# Patient Record
Sex: Female | Born: 1945 | Race: White | Hispanic: No
Health system: Southern US, Community
[De-identification: ages and names within clinical notes are randomized; demographics above are authoritative.]

---

## 2016-08-12 ENCOUNTER — Encounter (HOSPITAL_COMMUNITY): Payer: Self-pay | Admitting: Emergency Medicine

## 2016-08-12 ENCOUNTER — Emergency Department (HOSPITAL_COMMUNITY)
Admission: EM | Admit: 2016-08-12 | Discharge: 2016-08-12 | Disposition: A | Discharge status: Home / Self Care | Payer: Medicare HMO | Attending: Emergency Medicine | Admitting: Emergency Medicine

## 2016-08-12 ENCOUNTER — Emergency Department (HOSPITAL_COMMUNITY): Payer: Medicare HMO

## 2016-08-12 DIAGNOSIS — R0602 Shortness of breath: Secondary | ICD-10-CM

## 2016-08-12 DIAGNOSIS — I2510 Atherosclerotic heart disease of native coronary artery without angina pectoris: Secondary | ICD-10-CM | POA: Insufficient documentation

## 2016-08-12 DIAGNOSIS — Z853 Personal history of malignant neoplasm of breast: Secondary | ICD-10-CM | POA: Insufficient documentation

## 2016-08-12 DIAGNOSIS — R911 Solitary pulmonary nodule: Secondary | ICD-10-CM | POA: Diagnosis not present

## 2016-08-12 DIAGNOSIS — I252 Old myocardial infarction: Secondary | ICD-10-CM | POA: Diagnosis not present

## 2016-08-12 DIAGNOSIS — J209 Acute bronchitis, unspecified: Secondary | ICD-10-CM | POA: Insufficient documentation

## 2016-08-12 DIAGNOSIS — I509 Heart failure, unspecified: Secondary | ICD-10-CM | POA: Diagnosis not present

## 2016-08-12 LAB — COMPREHENSIVE METABOLIC PANEL
ALBUMIN: 3.4 g/dL — AB (ref 3.5–5.0)
ALK PHOS: 132 U/L — AB (ref 38–126)
ALT: 13 U/L — AB (ref 14–54)
ANION GAP: 11 (ref 5–15)
AST: 16 U/L (ref 15–41)
BILIRUBIN TOTAL: 0.5 mg/dL (ref 0.3–1.2)
BUN: 11 mg/dL (ref 6–20)
CALCIUM: 9.5 mg/dL (ref 8.9–10.3)
CO2: 27 mmol/L (ref 22–32)
CREATININE: 1.06 mg/dL — AB (ref 0.44–1.00)
Chloride: 97 mmol/L — ABNORMAL LOW (ref 101–111)
GFR calc Af Amer: 59 mL/min — ABNORMAL LOW (ref >60)
GFR calc non Af Amer: 51 mL/min — ABNORMAL LOW (ref >60)
GLUCOSE: 148 mg/dL — AB (ref 65–99)
Potassium: 3.5 mmol/L (ref 3.5–5.1)
SODIUM: 135 mmol/L (ref 135–145)
TOTAL PROTEIN: 6.8 g/dL (ref 6.5–8.1)

## 2016-08-12 LAB — CBC WITH DIFFERENTIAL/PLATELET
BASOS PCT: 1 %
Basophils Absolute: 0.1 10*3/uL (ref 0.0–0.1)
EOS PCT: 2 %
Eosinophils Absolute: 0.2 10*3/uL (ref 0.0–0.7)
HEMATOCRIT: 41.4 % (ref 36.0–46.0)
Hemoglobin: 13.7 g/dL (ref 12.0–15.0)
Lymphocytes Relative: 24 %
Lymphs Abs: 1.7 10*3/uL (ref 0.7–4.0)
MCH: 28.3 pg (ref 26.0–34.0)
MCHC: 33.1 g/dL (ref 30.0–36.0)
MCV: 85.5 fL (ref 78.0–100.0)
MONO ABS: 0.4 10*3/uL (ref 0.1–1.0)
MONOS PCT: 6 %
Neutro Abs: 5 10*3/uL (ref 1.7–7.7)
Neutrophils Relative %: 67 %
PLATELETS: 430 10*3/uL — AB (ref 150–400)
RBC: 4.84 MIL/uL (ref 3.87–5.11)
RDW: 12.7 % (ref 11.5–15.5)
WBC: 7.4 10*3/uL (ref 4.0–10.5)

## 2016-08-12 LAB — BRAIN NATRIURETIC PEPTIDE: B Natriuretic Peptide: 30.1 pg/mL (ref 0.0–100.0)

## 2016-08-12 LAB — I-STAT TROPONIN, ED: Troponin i, poc: 0 ng/mL (ref 0.00–0.08)

## 2016-08-12 LAB — I-STAT CG4 LACTIC ACID, ED: Lactic Acid, Venous: 1.64 mmol/L (ref 0.5–1.9)

## 2016-08-12 MED ORDER — HYDROCODONE-ACETAMINOPHEN 5-325 MG PO TABS
1.0000 | ORAL_TABLET | Freq: Once | ORAL | Status: AC
  Administered 2016-08-12: 1 via ORAL
  Filled 2016-08-12: qty 1

## 2016-08-12 MED ORDER — AZITHROMYCIN 250 MG PO TABS
500.0000 mg | ORAL_TABLET | Freq: Once | ORAL | Status: AC
  Administered 2016-08-12: 500 mg via ORAL
  Filled 2016-08-12: qty 2

## 2016-08-12 MED ORDER — HYDROCODONE-ACETAMINOPHEN 5-325 MG PO TABS: 2.0000 | ORAL_TABLET | ORAL | 0 refills | Status: AC | PRN

## 2016-08-12 MED ORDER — AZITHROMYCIN 250 MG PO TABS: 250.0000 mg | ORAL_TABLET | Freq: Every day | ORAL | 0 refills | Status: AC

## 2016-08-12 MED ORDER — IOPAMIDOL (ISOVUE-370) INJECTION 76%
100.0000 mL | Freq: Once | INTRAVENOUS | Status: AC | PRN
  Administered 2016-08-12: 100 mL via INTRAVENOUS

## 2016-08-12 MED ORDER — ALBUTEROL SULFATE HFA 108 (90 BASE) MCG/ACT IN AERS
2.0000 | INHALATION_SPRAY | Freq: Once | RESPIRATORY_TRACT | Status: AC
  Administered 2016-08-12: 2 via RESPIRATORY_TRACT
  Filled 2016-08-12: qty 6.7

## 2016-08-12 NOTE — ED Provider Notes (Signed)
Pt seen and evaluated. Discussed with resident. Pt with distand h/o Breast Ca s/p Partial resection, Ctx, and Rtx. Pt sob. Afebrile, Not hypoxemic. CXR with nonspecific density right hemithorax. CT with ?infiltrative carcinomatosis, ?scarring vs ?active disease. NO PE, no Pneumonia.  Agree with DC. Agree with antibiotic and albuterol. Discussed with patient that she will need oncology f/u and likely additional testing to determine active dz vs. Scarring/effects of treatment.   Rolland PorterJames, Shaniquia Brafford, MD 08/12/16 2006

## 2016-08-12 NOTE — ED Notes (Signed)
The pt has a little pain with irrigation bu the site lookd ok  No swelling  It irrigates well

## 2016-08-12 NOTE — ED Triage Notes (Signed)
Pt to ER for nonproductive cough x3-4 days with shortness of breath and pain in mid back. A/o x4. Sats 100% on RA.

## 2016-08-12 NOTE — ED Notes (Signed)
Doctor at the bedside 

## 2016-08-12 NOTE — ED Provider Notes (Signed)
[facility]-EMERGENCY DEPT Provider Note   CSN: [id number] Arrival date & time: 08/12/16  1152     History   Chief Complaint Chief Complaint  Patient presents with  . Shortness of Breath    HPI Tracy Beck is a 71 y.o. female.  The history is provided by the patient.  Shortness of Breath  This is a new problem. The average episode lasts 4 days. The problem occurs frequently.The current episode started more than 2 days ago. The problem has been gradually worsening. Associated symptoms include cough. Pertinent negatives include no fever, no headaches, no coryza, no rhinorrhea, no sore throat, no swollen glands, no ear pain, no neck pain, no sputum production, no hemoptysis, no wheezing, no chest pain, no vomiting, no abdominal pain, no rash, no leg pain, no leg swelling and no claudication. It is unknown what precipitated the problem. She has tried nothing for the symptoms. The treatment provided no relief. Associated medical issues include CAD, heart failure and past MI. Associated medical issues do not include PE, DVT or recent surgery.    History reviewed. No pertinent past medical history.  There are no active problems to display for this patient.   History reviewed. No pertinent surgical history.  OB History    No data available       Home Medications    Prior to Admission medications   Medication Sig Start Date End Date Taking? Authorizing Provider  azithromycin (ZITHROMAX) 250 MG tablet Take 1 tablet (250 mg total) by mouth daily. Take first 2 tablets together, then 1 every day until finished. 08/12/16 08/16/16  Dawnya Grams, DO  HYDROcodone-acetaminophen (NORCO/VICODIN) 5-325 MG tablet Take 2 tablets by mouth every 4 (four) hours as needed. 08/12/16   Virgina Norfolk, DO    Family History No family history on file.  Social History Social History  Substance Use Topics  . Smoking status: Unknown If Ever Smoked  . Smokeless tobacco: Never Used  . Alcohol use Not on file       Allergies   Patient has no allergy information on record.   Review of Systems Review of Systems  Constitutional: Negative for chills and fever.  HENT: Negative for ear pain, rhinorrhea and sore throat.   Eyes: Negative for pain and visual disturbance.  Respiratory: Positive for cough and shortness of breath. Negative for hemoptysis, sputum production and wheezing.   Cardiovascular: Negative for chest pain, palpitations, claudication and leg swelling.  Gastrointestinal: Negative for abdominal pain and vomiting.  Genitourinary: Negative for dysuria and hematuria.  Musculoskeletal: Negative for arthralgias, back pain and neck pain.  Skin: Negative for color change and rash.  Neurological: Negative for seizures, syncope and headaches.  All other systems reviewed and are negative.    Physical Exam Updated Vital Signs  ED Triage Vitals [08/12/16 1204]  Enc Vitals Group     BP 108/60     Pulse Rate 82     Resp (!) 24     Temp 97.5 F (36.4 C)     Temp Source Oral     SpO2 100 %     Weight      Height      Head Circumference      Peak Flow      Pain Score 8     Pain Loc      Pain Edu?      Excl. in GC?     Physical Exam  Constitutional: She is oriented to person, place, and time. She appears  well-developed and well-nourished. No distress.  HENT:  Head: Normocephalic and atraumatic.  Eyes: Conjunctivae are normal. Pupils are equal, round, and reactive to light.  Neck: Normal range of motion. Neck supple.  Cardiovascular: Normal rate, regular rhythm, normal heart sounds and intact distal pulses.   Pulmonary/Chest: Effort normal. No respiratory distress. She has no wheezes. She has rales.  Abdominal: Soft. There is no tenderness.  Musculoskeletal: Normal range of motion. She exhibits no edema.  Neurological: She is alert and oriented to person, place, and time.  Skin: Skin is warm and dry. Capillary refill takes less than 2 seconds.  Psychiatric: She has a normal  mood and affect.  Nursing note and vitals reviewed.    ED Treatments / Results  Labs (all labs ordered are listed, but only abnormal results are displayed) Labs Reviewed  COMPREHENSIVE METABOLIC PANEL - Abnormal; Notable for the following:       Result Value   Chloride 97 (*)    Glucose, Bld 148 (*)    Creatinine, Ser 1.06 (*)    Albumin 3.4 (*)    ALT 13 (*)    Alkaline Phosphatase 132 (*)    GFR calc non Af Amer 51 (*)    GFR calc Af Amer 59 (*)    All other components within normal limits  CBC WITH DIFFERENTIAL/PLATELET - Abnormal; Notable for the following:    Platelets 430 (*)    All other components within normal limits  BRAIN NATRIURETIC PEPTIDE  I-STAT CG4 LACTIC ACID, ED  I-STAT CG4 LACTIC ACID, ED  I-STAT TROPOININ, ED    EKG  EKG Interpretation None       Radiology Dg Chest 2 View  Result Date: 08/12/2016 CLINICAL DATA:  Sensation of a mass in the right back with shortness of breath for the past few days. Dry cough for the past week. EXAM: CHEST  2 VIEW COMPARISON:  None. FINDINGS: Normal sized heart. Post CABG changes. Coronary artery stent. Prominent interstitial markings, greater on the right with mild asymmetrical prominence of the pulmonary vasculature in the right lower lung zone. No pleural fluid. Unremarkable bones. Approximately 25% L1 superior endplate compression deformity with mild bony retropulsion and no visible acute fracture lines. IMPRESSION: 1. Mild asymmetrical interstitial pulmonary edema or pneumonitis, greater on the right. 2. Mild asymmetrical pulmonary vascular congestion in the right lower lung zone. Electronically Signed   By: Beckie SaltsSteven  Reid M.D.   On: 08/12/2016 13:26   Ct Angio Chest Pe W Or Wo Contrast  Result Date: 08/12/2016 CLINICAL DATA:  71 y/o  F; shortness of breath and mid back pain. EXAM: CT ANGIOGRAPHY CHEST WITH CONTRAST TECHNIQUE: Multidetector CT imaging of the chest was performed using the standard protocol during bolus  administration of intravenous contrast. Multiplanar CT image reconstructions and MIPs were obtained to evaluate the vascular anatomy. CONTRAST:  100 cc Isovue 370 COMPARISON:  None. FINDINGS: Cardiovascular: Satisfactory opacification of the pulmonary arteries to the segmental level. No evidence of pulmonary embolism. Normal caliber thoracic aorta with mild calcific atherosclerosis. Normal heart size. Severe coronary artery calcifications status post CABG. Mediastinum/Nodes: 8 mm nodule in the left lobe of the thyroid. Prominent mediastinal lymph nodes the largest in the prevascular space measuring 8 x 13 mm (series 5, image 38). Left axillary adenopathy measuring 13 x 13 mm (series 5, image 24). Postsurgical changes related to right axial lymph node dissection. Ill-defined stellate focus of soft tissue in the right lateral breast at the site of multiple surgical staples  spanning 21 mm (series 5, image 57). Lungs/Pleura: Numerous pulmonary nodules scattered throughout the lungs measuring up to 10 mm in the right lung apex (series 5, image 21). Septal thickening greatest within the right middle and lower lobes. Diffuse peribronchial thickening. Upper Abdomen: 14 mm left adrenal nodule measuring -13 HU compatible with adenoma. Soft tissue attenuating lesions within the liver in segment 7 measuring 11 mm and segment 8 measuring 29 mm (series 5, image 113). Musculoskeletal: 14 mm lucent lesion within the T2 vertebral body (series 9, image 75). Healed median sternotomy. Review of the MIP images confirms the above findings. IMPRESSION: 1. Numerous pulmonary nodules, T2 vertebral body lucent lesion, and lesions within segment 7 and segment 8 of the liver. Findings are most consistent with metastatic disease. 2. Ill-defined stellate soft tissue in the right lateral breast adjacent to multiple surgical staples may represent scarring. Neoplasm not excluded. Correlate for history of breast cancer. 3. No pulmonary embolus  identified. 4. Severe coronary artery calcifications post CABG. 5. Left adrenal adenoma. 6. Diffuse peribronchial thickening may represent acute bronchitis. 7. Septal thickening greatest in the right middle and lower lobes may represent interstitial edema or lymphangitic carcinomatosis. Electronically Signed   By: Mitzi Hansen M.D.   On: 08/12/2016 19:36    Procedures Procedures (including critical care time)  Medications Ordered in ED Medications  azithromycin (ZITHROMAX) tablet 500 mg (not administered)  albuterol (PROVENTIL HFA;VENTOLIN HFA) 108 (90 Base) MCG/ACT inhaler 2 puff (not administered)  HYDROcodone-acetaminophen (NORCO/VICODIN) 5-325 MG per tablet 1 tablet (not administered)  iopamidol (ISOVUE-370) 76 % injection 100 mL (100 mLs Intravenous Contrast Given 08/12/16 1857)     Initial Impression / Assessment and Plan / ED Course  I have reviewed the triage vital signs and the nursing notes.  Pertinent labs & imaging results that were available during my care of the patient were reviewed by me and considered in my medical decision making (see chart for details).     Tracy Beck is a 71 year old female with history of breast cancer, CAD status post CABG, high blood pressure who presents to the ED with shortness of breath. Patient's vitals at time of arrival to the ED are unremarkable and patient is without fever. Patient states that she has had shortness of breath over the last several days with cough but no sputum production. Patient denies fever, chills. Patient states that she was a former smoker and is not on any inhalers and not aware of diagnosis of COPD. Patient with no chest pain, no abdominal pain, no leg swelling. On exam, patient has crackles throughout but no peripheral edema and no JVD. Exam is overall unremarkable otherwise. To evaluate will get CBC, CMP, lactic acid, BNP, troponin, EKG, chest x-ray.  Patient with EKG that shows normal sinus rhythm with no  signs of ischemia. There is no prior EKG to compare to. Troponin within normal limits. Patient with no chest pain and doubt ACS. Patient with BNP within normal limits and chest x-ray that shows some asymmetric edema versus pneumonitis. However, patient with no signs of volume overload on exam otherwise. Possible patient has PE versus pneumonia versus mass. Patient states that she was told that she had possible pulmonary nodule several months ago and has not had a chest CT. Patient otherwise with no significant anemia or electrolyte abnormalities. Patient with normal lactic acid and doubt infectious process.  CT PE study shows numerous pulmonary nodules with possible metastatic disease to the spine. Possible recurrent breast cancer. Patient with no  PE. Possible acute bronchitis. Patient made aware of findings and recommend follow-up with oncology. Patient states that she really has follow-up with pulmonology. Patient understands that she must follow-up with oncology, primary care, pulmonology. Patient likely will need PET scan. Will treat for acute bronchitis with a Zithromax, albuterol. Patient given Norco for pain and given prescription for Norco. Patient was looked up in the [place] controlled substance database and has no active prescriptions. Patient discharged from the ED in good condition. Patient with normal vital signs at time of discharge.  Final Clinical Impressions(s) / ED Diagnoses   Final diagnoses:  SOB (shortness of breath)  Acute bronchitis, unspecified organism  Pulmonary nodule    New Prescriptions New Prescriptions   AZITHROMYCIN (ZITHROMAX) 250 MG TABLET    Take 1 tablet (250 mg total) by mouth daily. Take first 2 tablets together, then 1 every day until finished.   HYDROCODONE-ACETAMINOPHEN (NORCO/VICODIN) 5-325 MG TABLET    Take 2 tablets by mouth every 4 (four) hours as needed.     Virgina Norfolk, DO 08/12/16 2015    Rolland Porter, MD 08/13/16 1504    Rolland Porter, MD 08/13/16 [phone]

## 2016-08-12 NOTE — ED Notes (Signed)
Ambulated pt in hallway while monitoring O2. Pt started at 98% while sitting. While ambulating, pt's O2 dropped to 94%. Pt's O2 read between 97%-94% while ambulating. Pt stated she felt very winded while ambulating as if she needed to sit down and take a break. Pt back in bed and on monitor O2 reading 100%

## 2016-08-12 NOTE — ED Notes (Signed)
The pt has back pain for about a  Week  Ans she has also fallen  Causing her to have some back pain.. Alert oriented skin waerm and  dry

## 2016-08-12 NOTE — ED Notes (Signed)
Placed pt on bedpan, tolerated well. 

## 2016-08-12 NOTE — ED Notes (Signed)
Doctor at  the bedisde 

## 2016-08-24 ENCOUNTER — Institutional Professional Consult (permissible substitution): Payer: Self-pay | Admitting: Pulmonary Disease

## 2016-10-14 DEATH — deceased

## 2018-01-26 IMAGING — CT CT ANGIO CHEST
2 of 6 series · 18 of 36 positions shown · IV contrast (Omni 300)
Comparison: None.

CLINICAL DATA: 71 y/o  F; shortness of breath and mid back pain.

EXAM:
CT ANGIOGRAPHY CHEST WITH CONTRAST
TECHNIQUE: Multidetector CT imaging of the chest was performed using the
standard protocol during bolus administration of intravenous
contrast. Multiplanar CT image reconstructions and MIPs were
obtained to evaluate the vascular anatomy.
CONTRAST:  100 cc Isovue 370

[Series 7: pe thins · axial · 0.68mm/px · z∈[+1188,+1412]mm · 17 of 254 slices shown]
[im 15/254  lung]
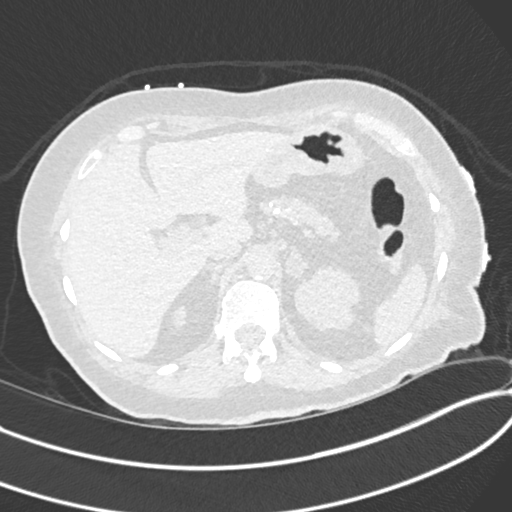
[im 29/254  mediastinal]
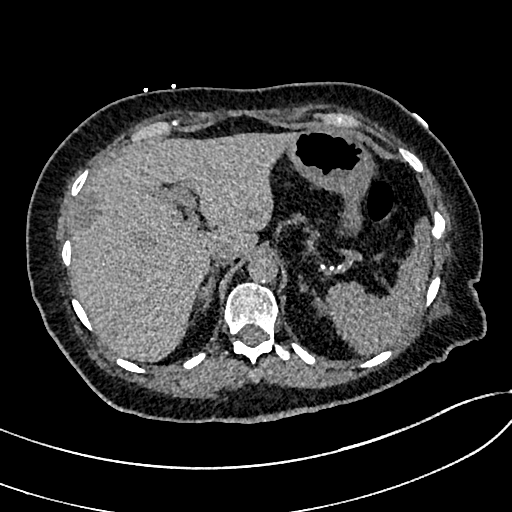
[im 43/254  lung]
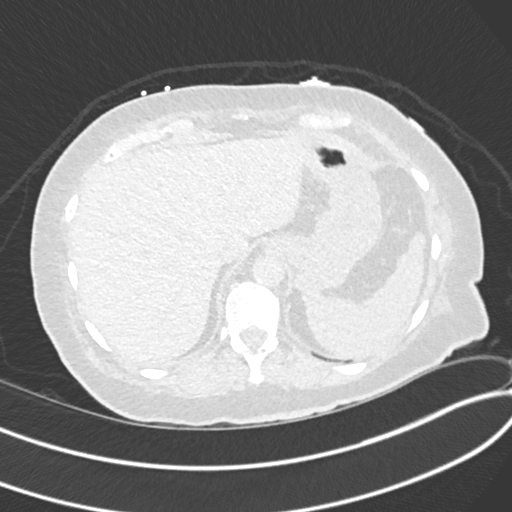
[im 57/254  mediastinal]
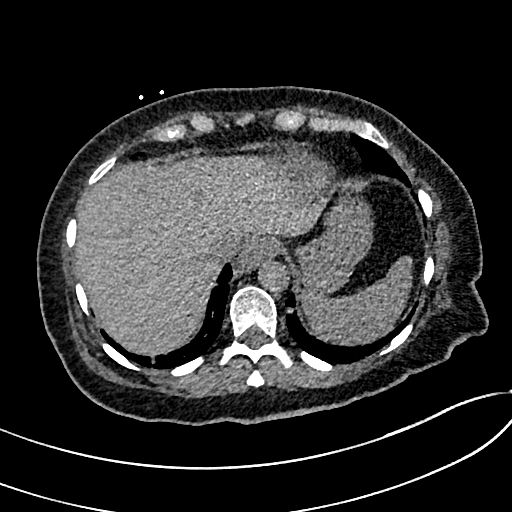
[im 71/254  lung]
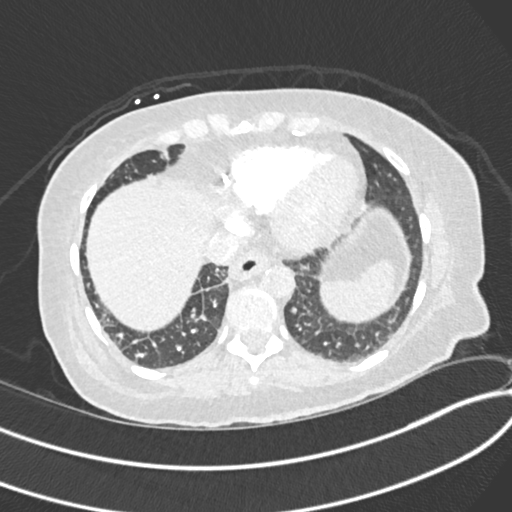
[im 85/254  mediastinal]
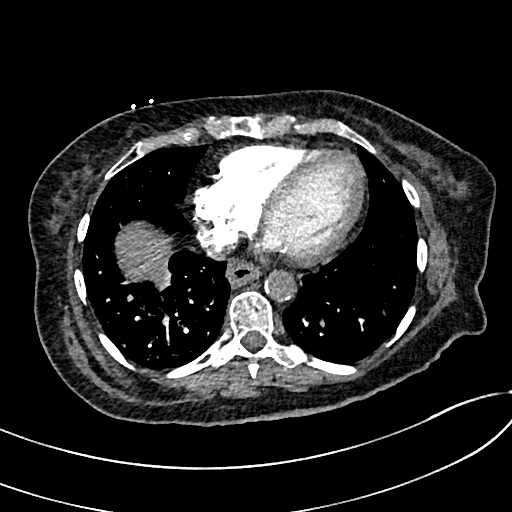
[im 99/254  lung]
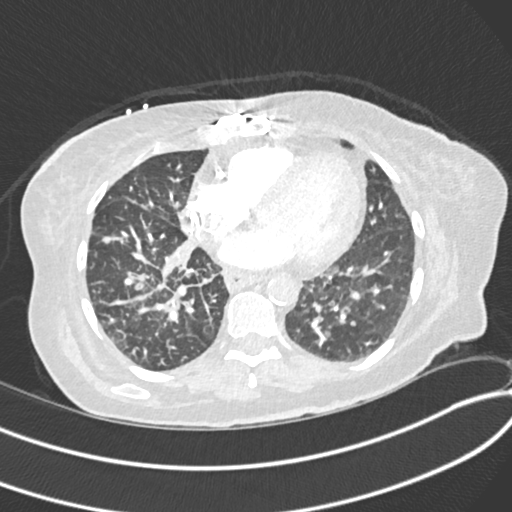
[im 113/254  mediastinal]
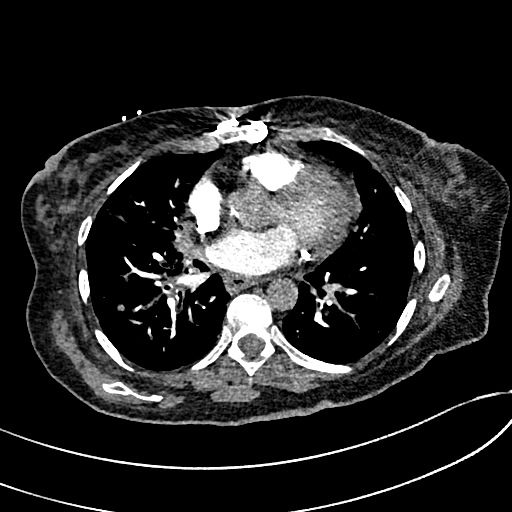
[im 127/254  lung]
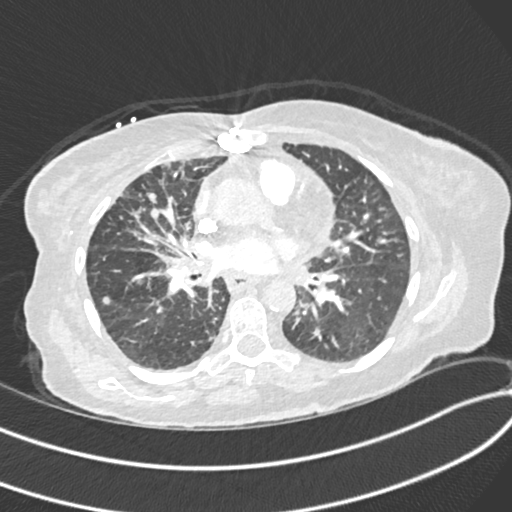
[im 141/254  mediastinal]
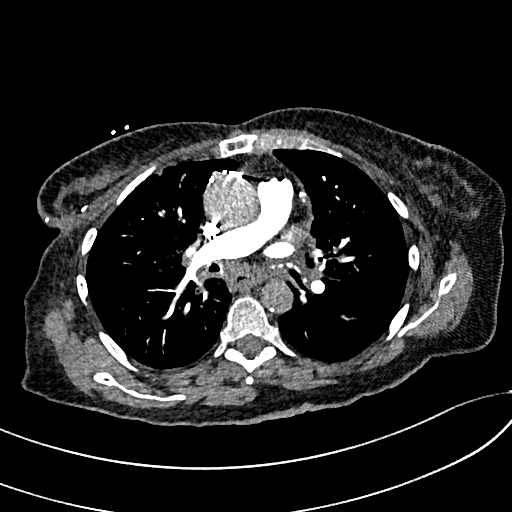
[im 155/254  lung]
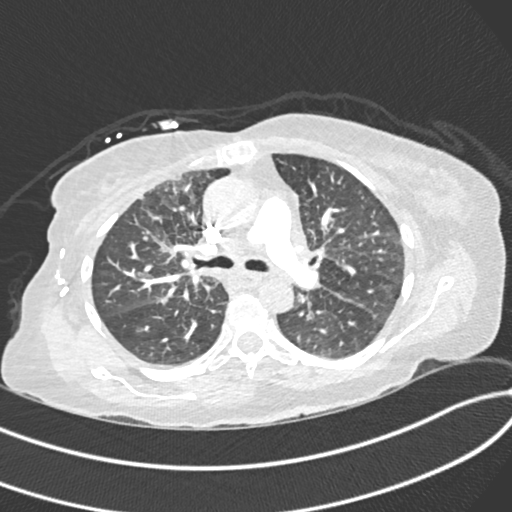
[im 169/254  mediastinal]
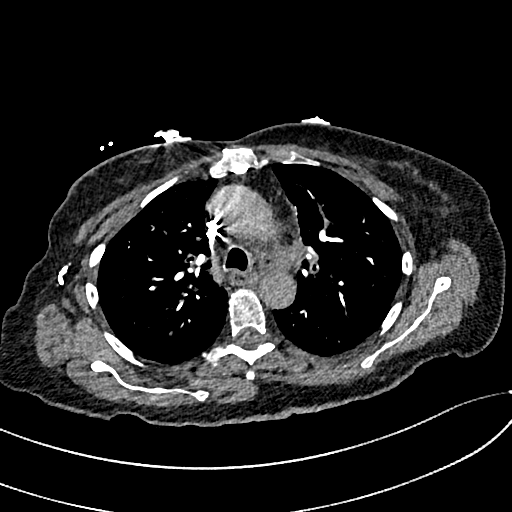
[im 183/254  lung]
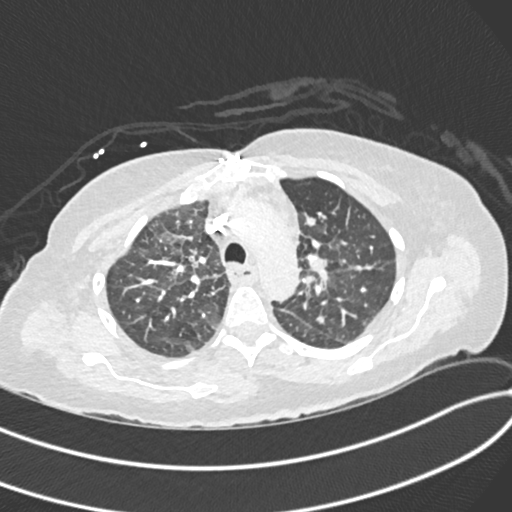
[im 197/254  mediastinal]
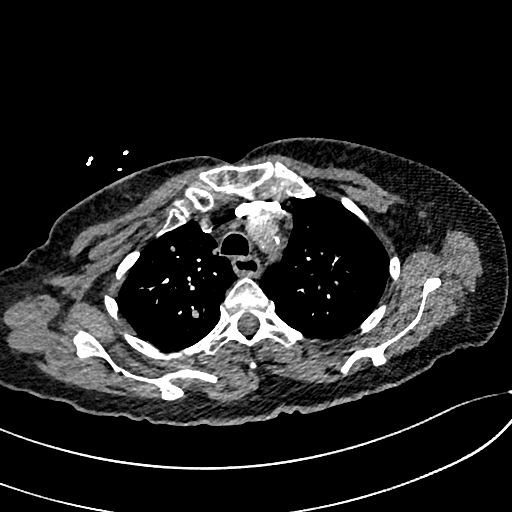
[im 211/254  lung]
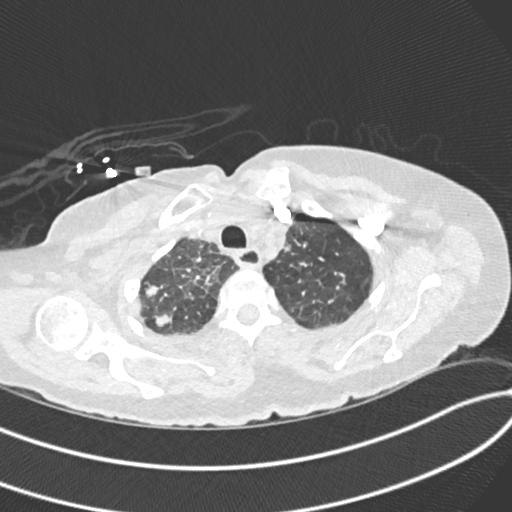
[im 225/254  mediastinal]
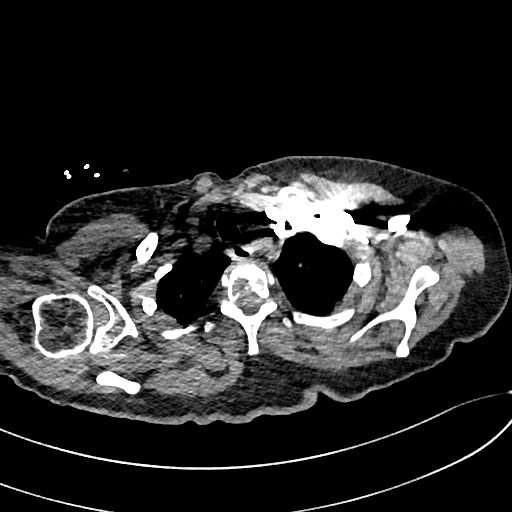
[im 239/254  lung]
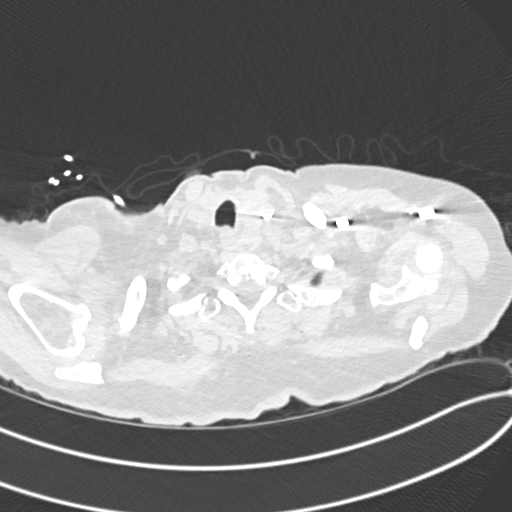

[Series 8: pe 2mm cor · coronal · 0.51mm/px · 1 of 119 slices shown]
[im 60/119  mediastinal]
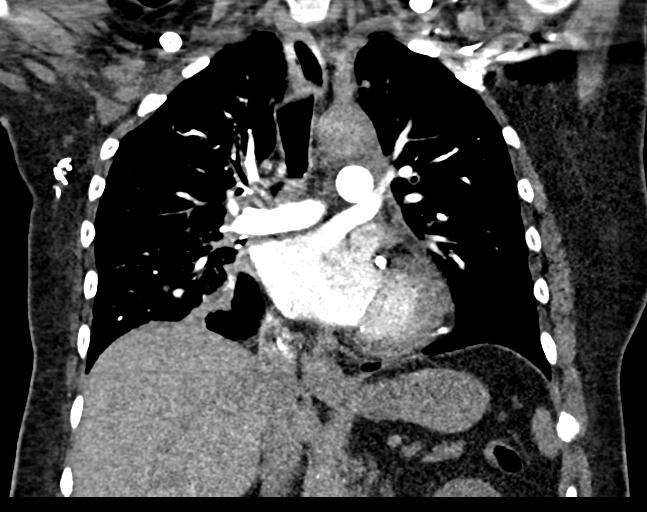

[18 of 36 positions shown; findings below may reference images not displayed]

FINDINGS: Cardiovascular: Satisfactory opacification of the pulmonary arteries
to the segmental level. No evidence of pulmonary embolism.

Normal caliber thoracic aorta with mild calcific atherosclerosis.
Normal heart size. Severe coronary artery calcifications status post
CABG.

Mediastinum/Nodes: 8 mm nodule in the left lobe of the thyroid.
Prominent mediastinal lymph nodes the largest in the prevascular
space measuring 8 x 13 mm (series 5, image 38). Left axillary
adenopathy measuring 13 x 13 mm (series 5, image 24). Postsurgical
changes related to right axial lymph node dissection. Ill-defined
stellate focus of soft tissue in the right lateral breast at the
site of multiple surgical staples spanning 21 mm (series 5, image
57).

Lungs/Pleura: Numerous pulmonary nodules scattered throughout the
lungs measuring up to 10 mm in the right lung apex (series 5, image
21). Septal thickening greatest within the right middle and lower
lobes. Diffuse peribronchial thickening.

Upper Abdomen: 14 mm left adrenal nodule measuring -13 HU compatible
with adenoma. Soft tissue attenuating lesions within the liver in
segment 7 measuring 11 mm and segment 8 measuring 29 mm (series 5,
image 113).

Musculoskeletal: 14 mm lucent lesion within the T2 vertebral body
(series 9, image 75). Healed median sternotomy.

Review of the MIP images confirms the above findings.
IMPRESSION: 1. Numerous pulmonary nodules, T2 vertebral body lucent lesion, and
lesions within segment 7 and segment 8 of the liver. Findings are
most consistent with metastatic disease.
2. Ill-defined stellate soft tissue in the right lateral breast
adjacent to multiple surgical staples may represent scarring.
Neoplasm not excluded. Correlate for history of breast cancer.
3. No pulmonary embolus identified.
4. Severe coronary artery calcifications post CABG.
5. Left adrenal adenoma.
6. Diffuse peribronchial thickening may represent acute bronchitis.
7. Septal thickening greatest in the right middle and lower lobes
may represent interstitial edema or lymphangitic carcinomatosis.

By: Tejaswi Kroll M.D.
